# Patient Record
Sex: Female | Born: 1990 | Race: White | Hispanic: No | Marital: Single | Smoking: Current some day smoker
Health system: Southern US, Community
[De-identification: ages and names within clinical notes are randomized; demographics above are authoritative.]

## PROBLEM LIST (undated history)

## (undated) DIAGNOSIS — R569 Unspecified convulsions: Secondary | ICD-10-CM

## (undated) DIAGNOSIS — M25569 Pain in unspecified knee: Secondary | ICD-10-CM

## (undated) HISTORY — DX: Pain in unspecified knee: M25.569

## (undated) HISTORY — DX: Unspecified convulsions: R56.9

## (undated) HISTORY — PX: KNEE SURGERY: SHX244

---
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
## 2012-05-27 ENCOUNTER — Encounter (HOSPITAL_COMMUNITY): Payer: Self-pay | Admitting: *Deleted

## 2012-05-27 ENCOUNTER — Emergency Department (HOSPITAL_COMMUNITY): Payer: Medicaid Other

## 2012-05-27 ENCOUNTER — Emergency Department (HOSPITAL_COMMUNITY)
Admission: EM | Admit: 2012-05-27 | Discharge: 2012-05-28 | Disposition: A | Discharge status: Home / Self Care | Payer: Medicaid Other | Attending: Emergency Medicine | Admitting: Emergency Medicine

## 2012-05-27 DIAGNOSIS — Z3202 Encounter for pregnancy test, result negative: Secondary | ICD-10-CM | POA: Insufficient documentation

## 2012-05-27 DIAGNOSIS — M549 Dorsalgia, unspecified: Secondary | ICD-10-CM | POA: Insufficient documentation

## 2012-05-27 DIAGNOSIS — N390 Urinary tract infection, site not specified: Secondary | ICD-10-CM | POA: Insufficient documentation

## 2012-05-27 DIAGNOSIS — Z79899 Other long term (current) drug therapy: Secondary | ICD-10-CM | POA: Insufficient documentation

## 2012-05-27 DIAGNOSIS — R300 Dysuria: Secondary | ICD-10-CM | POA: Insufficient documentation

## 2012-05-27 LAB — URINE MICROSCOPIC-ADD ON

## 2012-05-27 LAB — URINALYSIS, ROUTINE W REFLEX MICROSCOPIC
Leukocytes, UA: NEGATIVE
Nitrite: NEGATIVE
Protein, ur: NEGATIVE mg/dL
Specific Gravity, Urine: 1.01 (ref 1.005–1.030)
Urobilinogen, UA: 0.2 mg/dL (ref 0.0–1.0)

## 2012-05-27 LAB — PREGNANCY, URINE: Preg Test, Ur: NEGATIVE

## 2012-05-27 MED ORDER — MORPHINE SULFATE 4 MG/ML IJ SOLN
4.0000 mg | Freq: Once | INTRAMUSCULAR | Status: AC
  Administered 2012-05-27: 4 mg via INTRAMUSCULAR
  Filled 2012-05-27: qty 1

## 2012-05-27 MED ORDER — ONDANSETRON 8 MG PO TBDP
8.0000 mg | ORAL_TABLET | Freq: Once | ORAL | Status: AC
  Administered 2012-05-27: 8 mg via ORAL
  Filled 2012-05-27: qty 1

## 2012-05-27 NOTE — ED Provider Notes (Signed)
History     CSN: [id number]  Arrival date & time 05/27/12  2156   First MD Initiated Contact with Patient 05/27/12 2205      Chief Complaint  Patient presents with  . Abdominal Pain    (Consider location/radiation/quality/duration/timing/severity/associated sxs/prior treatment) HPI Comments: Laurie Grant is a 22 y.o. Female presenting with back pain and painful urination.  She reports having right mid back pain which started yesterday which was sharp,  Constant and not worsened with movement.  This symptom was better today despite no treatment,  Then 4 hours ago developed severe burning pain at her urethra which is worsened with urination.  She denies fevers, chills, nausea and vomiting.  She has had no hematuria and denies vaginal discharge.  Her LMP is unknown as she is on depo. She does have a family history of kidney stones.     The history is provided by the patient.    History reviewed. No pertinent past medical history.  Past Surgical History  Procedure Laterality Date  . Knee surgery      History reviewed. No pertinent family history.  History  Substance Use Topics  . Smoking status: Never Smoker   . Smokeless tobacco: Not on file  . Alcohol Use: No    OB History   Grav Para Term Preterm Abortions TAB SAB Ect Mult Living                  Review of Systems  Constitutional: Negative for fever and chills.  HENT: Negative for congestion, sore throat and neck pain.   Eyes: Negative.   Respiratory: Negative for chest tightness and shortness of breath.   Cardiovascular: Negative for chest pain.  Gastrointestinal: Negative for nausea and abdominal pain.  Genitourinary: Positive for dysuria.  Musculoskeletal: Positive for back pain. Negative for joint swelling and arthralgias.  Skin: Negative.  Negative for rash and wound.  Neurological: Negative for dizziness, weakness, light-headedness, numbness and headaches.  Psychiatric/Behavioral: Negative.      Allergies  Amoxicillin; Penicillins; and Tramadol  Home Medications   Current Outpatient Rx  Name  Route  Sig  Dispense  Refill  . albuterol (PROVENTIL HFA;VENTOLIN HFA) 108 (90 BASE) MCG/ACT inhaler   Inhalation   Inhale 2 puffs into the lungs every 6 (six) hours as needed for wheezing or shortness of breath.         Marland Kitchen ibuprofen (ADVIL,MOTRIN) 200 MG tablet   Oral   Take 800 mg by mouth daily as needed for pain.         . medroxyPROGESTERone (DEPO-PROVERA) 150 MG/ML injection   Intramuscular   Inject 150 mg into the muscle every 3 (three) months. Due in August for next injection          . Naproxen Sodium (ALEVE) 220 MG CAPS   Oral   Take 220 mg by mouth once as needed (for pain).         Marland Kitchen HYDROcodone-acetaminophen (NORCO/VICODIN) 5-325 MG per tablet   Oral   Take 1 tablet by mouth every 4 (four) hours as needed for pain.   6 tablet   0   . nitrofurantoin, macrocrystal-monohydrate, (MACROBID) 100 MG capsule   Oral   Take 1 capsule (100 mg total) by mouth 2 (two) times daily.   10 capsule   0   . phenazopyridine (PYRIDIUM) 200 MG tablet   Oral   Take 1 tablet (200 mg total) by mouth 3 (three) times daily.   6 tablet  0     BP 126/81  Pulse 93  Temp(Src) 97.9 F (36.6 C) (Oral)  Resp 18  SpO2 95%  Physical Exam  Nursing note and vitals reviewed. Constitutional: She appears well-developed and well-nourished.  HENT:  Head: Normocephalic and atraumatic.  Eyes: Conjunctivae are normal.  Neck: Normal range of motion.  Cardiovascular: Normal rate, regular rhythm, normal heart sounds and intact distal pulses.   Pulmonary/Chest: Effort normal and breath sounds normal. She has no wheezes.  Abdominal: Soft. Bowel sounds are normal. She exhibits no distension. There is tenderness in the right lower quadrant and suprapubic area. There is CVA tenderness. There is no rebound, no guarding and no tenderness at McBurney's point.  Musculoskeletal: Normal range  of motion.  Neurological: She is alert.  Skin: Skin is warm and dry.  Psychiatric: She has a normal mood and affect.    ED Course  Procedures (including critical care time)  Labs Reviewed  URINALYSIS, ROUTINE W REFLEX MICROSCOPIC - Abnormal; Notable for the following:    Hgb urine dipstick MODERATE (*)    All other components within normal limits  URINE MICROSCOPIC-ADD ON - Abnormal; Notable for the following:    Squamous Epithelial / LPF FEW (*)    Bacteria, UA FEW (*)    All other components within normal limits  PREGNANCY, URINE   Ct Abdomen Pelvis Wo Contrast  05/28/2012  *RADIOLOGY REPORT*  Clinical Data: Flank pain  CT ABDOMEN AND PELVIS WITHOUT CONTRAST  Technique:  Multidetector CT imaging of the abdomen and pelvis was performed following the standard protocol without intravenous contrast.  Comparison: None.  Findings: Lung bases are clear.  Heart size within normal limits.  Organ abnormality/lesion detection is limited in the absence of intravenous contrast. Within this limitation, unremarkable liver, biliary system, spleen, pancreas, adrenal glands.  Symmetric renal size.  No hydronephrosis or hydroureter.  No urinary tract calculi.  No CT evidence for colitis.  Normal appendix.  Small bowel loops are normal course and caliber.  No free intraperitoneal air or fluid.  No lymphadenopathy.  Normal caliber aorta and branch vessels.  Small fat containing umbilical hernia.  Decompressed bladder limits evaluation.  Unremarkable CT appearance to the uterus and adnexa.  No acute osseous finding. Broad-based L5 S1 disc bulge results in mild central canal narrowing.  IMPRESSION: No hydronephrosis or urinary tract calculi identified.   Original Report Authenticated By: Jearld Lesch, M.D.      1. Dysuria   2. UTI (lower urinary tract infection)       MDM  Pt with symptoms suggesting possible kidney stone with family hx of same.  Ct scan negative.  Urine cx sent,  Will tx uti with  macrobid,  Pyridium for probable bladder spasm causing pain.  #6 hydrocodone for pain relief in the next 24 hours until abx and pyridium offer improvement.  Encouraged recheck by pcp if not improving this week.  Patients labs and/or radiological studies were reviewed during the medical decision making and disposition process.         Burgess Amor, PA-C 05/28/12 [phone]

## 2012-05-27 NOTE — ED Notes (Signed)
Low abd pain, dysuria,No d/c  .  NO nvd

## 2012-05-27 NOTE — ED Notes (Signed)
Pt c/o lower abdominal pain x 3hours. States there is burning with urination.

## 2012-05-28 MED ORDER — NITROFURANTOIN MONOHYD MACRO 100 MG PO CAPS: 100.0000 mg | ORAL_CAPSULE | Freq: Two times a day (BID) | ORAL | Status: DC

## 2012-05-28 MED ORDER — NITROFURANTOIN MONOHYD MACRO 100 MG PO CAPS
100.0000 mg | ORAL_CAPSULE | Freq: Once | ORAL | Status: AC
  Administered 2012-05-28: 100 mg via ORAL
  Filled 2012-05-28: qty 1

## 2012-05-28 MED ORDER — PHENAZOPYRIDINE HCL 200 MG PO TABS: 200.0000 mg | ORAL_TABLET | Freq: Three times a day (TID) | ORAL | Status: DC

## 2012-05-28 MED ORDER — HYDROCODONE-ACETAMINOPHEN 5-325 MG PO TABS: 1.0000 | ORAL_TABLET | ORAL | Status: DC | PRN

## 2012-05-28 MED ORDER — PHENAZOPYRIDINE HCL 100 MG PO TABS
200.0000 mg | ORAL_TABLET | Freq: Once | ORAL | Status: AC
  Administered 2012-05-28: 200 mg via ORAL
  Filled 2012-05-28: qty 2

## 2012-05-28 NOTE — ED Provider Notes (Signed)
Medical screening examination/treatment/procedure(s) were performed by non-physician practitioner and as supervising physician I was immediately available for consultation/collaboration.   September Mormile B. Bernette Mayers, MD 05/28/12 1207

## 2012-08-19 ENCOUNTER — Ambulatory Visit (INDEPENDENT_AMBULATORY_CARE_PROVIDER_SITE_OTHER): Payer: Medicaid Other

## 2012-08-19 ENCOUNTER — Encounter: Payer: Self-pay | Admitting: General Practice

## 2012-08-19 ENCOUNTER — Ambulatory Visit (INDEPENDENT_AMBULATORY_CARE_PROVIDER_SITE_OTHER): Payer: Medicaid Other | Admitting: General Practice

## 2012-08-19 VITALS — BP 138/86 | HR 103 | Temp 100.2°F | Ht 66.0 in | Wt 172.0 lb

## 2012-08-19 DIAGNOSIS — S99929A Unspecified injury of unspecified foot, initial encounter: Secondary | ICD-10-CM

## 2012-08-19 DIAGNOSIS — S8991XA Unspecified injury of right lower leg, initial encounter: Secondary | ICD-10-CM

## 2012-08-19 DIAGNOSIS — S8990XA Unspecified injury of unspecified lower leg, initial encounter: Secondary | ICD-10-CM

## 2012-08-19 DIAGNOSIS — S99919A Unspecified injury of unspecified ankle, initial encounter: Secondary | ICD-10-CM

## 2012-08-19 NOTE — Progress Notes (Signed)
  Subjective:    Patient ID: Laurie Grant, female    DOB: November 28, 1990, 22 y.o.   MRN: [id number]  HPI Presents today with right knee pain. Reports she fell over her bed 4 days ago. Reports having surgery 4-5 years ago (screw placed). Reports taking advil for pain and discomfort. Reports pain is worse at night. Rates pain at 8 and 5 during the day on 1-10 scales. Reports taking ibuprofen 800 mg at home.     Review of Systems  Constitutional: Positive for chills. Negative for fever.  HENT: Negative for neck pain.   Respiratory: Negative for chest tightness, shortness of breath and wheezing.   Cardiovascular: Negative for chest pain, palpitations and leg swelling.  Genitourinary: Negative for difficulty urinating.  Skin: Negative.   Neurological: Negative for dizziness, syncope, weakness and headaches.  Psychiatric/Behavioral: Negative.        Objective:   Physical Exam  Constitutional: She is oriented to person, place, and time. She appears well-developed and well-nourished.  Cardiovascular: Normal rate, regular rhythm and normal heart sounds.   Pulmonary/Chest: Effort normal and breath sounds normal. No respiratory distress. She exhibits no tenderness.  Musculoskeletal: She exhibits edema and tenderness.  Slight edema and edema noted to right patella and shin area. Negative for warmth to touch  Neurological: She is alert and oriented to person, place, and time.  Skin: Skin is warm and dry.  Psychiatric: She has a normal mood and affect.   [facility] reading (PRIMARY) by Philomena Doheny, FNP-C, no dislocation or fracture noted.                                   Assessment & Plan:  1. Knee injury, right, initial encounter - DG Knee Complete 4 Views Right; Future - Ambulatory referral to Orthopedic Surgery -refrain from strenuous activities -rest affected area -Continue current anti-inflammatory -may apply ice to affected area, 10 minutes on and 45 minutes off three times  daily Patient verbalized understanding Coralie Keens, FNP-C

## 2012-08-23 ENCOUNTER — Ambulatory Visit (INDEPENDENT_AMBULATORY_CARE_PROVIDER_SITE_OTHER): Payer: Medicaid Other | Admitting: *Deleted

## 2012-08-23 DIAGNOSIS — IMO0001 Reserved for inherently not codable concepts without codable children: Secondary | ICD-10-CM

## 2012-08-23 DIAGNOSIS — Z309 Encounter for contraceptive management, unspecified: Secondary | ICD-10-CM

## 2012-08-23 NOTE — Progress Notes (Signed)
Patient hasnt had depo provera since Nov 2013 so we went ahead and ordered a serum pregnancy

## 2012-08-29 ENCOUNTER — Other Ambulatory Visit (HOSPITAL_COMMUNITY): Payer: Self-pay | Admitting: Orthopaedic Surgery

## 2012-08-29 DIAGNOSIS — G8929 Other chronic pain: Secondary | ICD-10-CM

## 2012-09-04 ENCOUNTER — Ambulatory Visit (HOSPITAL_COMMUNITY): Payer: Medicaid Other

## 2012-09-06 ENCOUNTER — Ambulatory Visit (HOSPITAL_COMMUNITY)
Admission: RE | Admit: 2012-09-06 | Discharge: 2012-09-06 | Disposition: A | Discharge status: Home / Self Care | Payer: Medicaid Other | Source: Ambulatory Visit | Attending: Orthopaedic Surgery | Admitting: Orthopaedic Surgery

## 2012-09-06 DIAGNOSIS — M7989 Other specified soft tissue disorders: Secondary | ICD-10-CM | POA: Diagnosis not present

## 2012-09-06 DIAGNOSIS — M2240 Chondromalacia patellae, unspecified knee: Secondary | ICD-10-CM | POA: Diagnosis not present

## 2012-09-06 DIAGNOSIS — M25569 Pain in unspecified knee: Secondary | ICD-10-CM | POA: Insufficient documentation

## 2012-09-06 DIAGNOSIS — G8929 Other chronic pain: Secondary | ICD-10-CM

## 2012-09-09 ENCOUNTER — Telehealth: Payer: Self-pay | Admitting: General Practice

## 2012-09-11 NOTE — Telephone Encounter (Signed)
Message left with patient to have serum pregnancy drawn, once results are negative she will pick med up from pharmacy and bring her for injection.

## 2012-09-16 ENCOUNTER — Telehealth: Payer: Self-pay | Admitting: Nurse Practitioner

## 2012-09-16 ENCOUNTER — Ambulatory Visit: Payer: Medicaid Other | Admitting: General Practice

## 2012-09-16 NOTE — Telephone Encounter (Signed)
appt given with mae 

## 2012-09-17 ENCOUNTER — Ambulatory Visit: Payer: Self-pay | Admitting: Family Medicine

## 2012-11-05 ENCOUNTER — Other Ambulatory Visit: Payer: Self-pay

## 2012-11-05 MED ORDER — MEDROXYPROGESTERONE ACETATE 150 MG/ML IM SUSP: 150.0000 mg | INTRAMUSCULAR | Status: DC

## 2012-11-05 NOTE — Telephone Encounter (Signed)
Last seen 04/19/12  [facility]

## 2012-11-17 ENCOUNTER — Emergency Department (HOSPITAL_COMMUNITY)
Admission: EM | Admit: 2012-11-17 | Discharge: 2012-11-17 | Disposition: A | Discharge status: Home / Self Care | Payer: Medicaid Other

## 2012-11-17 NOTE — ED Notes (Signed)
Pt called a second time for Triage, no answer.

## 2012-11-17 NOTE — ED Notes (Signed)
Called for Triage, no answer.

## 2012-11-17 NOTE — ED Notes (Signed)
No answer in waiting room X3 

## 2012-11-26 ENCOUNTER — Ambulatory Visit (INDEPENDENT_AMBULATORY_CARE_PROVIDER_SITE_OTHER): Payer: Medicaid Other | Admitting: Family Medicine

## 2012-11-26 ENCOUNTER — Encounter: Payer: Self-pay | Admitting: Family Medicine

## 2012-11-26 VITALS — BP 119/79 | HR 119 | Temp 98.3°F | Ht 66.0 in | Wt 159.0 lb

## 2012-11-26 DIAGNOSIS — R569 Unspecified convulsions: Secondary | ICD-10-CM

## 2012-11-26 NOTE — Progress Notes (Signed)
  Subjective:    Patient ID: Laurie Grant, female    DOB: 07-08-1990, 22 y.o.   MRN: [id number]  HPI  Patient resents today for hospital followup. Patient is noted have been seen in the ER on Friday night for a witnessed seizure while sleeping. Patient and her boyfriend were in the bed.Per report, patient fell asleep and had a witnessed seizure with generalized shaking a nodule in the back of her head. Patient presented to the ER after event. Per the patient and mom she had an EEG that was normal. Patient reports that she does smoke marijuana on the event as well as took tramadol for pain. Patient also notes that she was PCP positive on drug screen in the ER although patient denies taking PCP or other drug use.  This is patient's first seizure event. Patient has not had a history of this in the past. Patient is been asymptomatic since this point.    Review of Systems  All other systems reviewed and are negative.       Objective:   Physical Exam  Constitutional: She is oriented to person, place, and time. She appears well-developed and well-nourished.  HENT:  Head: Normocephalic and atraumatic.  Right Ear: External ear normal.  Left Ear: External ear normal.  Eyes: Conjunctivae are normal. Pupils are equal, round, and reactive to light.  Neck: Normal range of motion. Neck supple.  Cardiovascular: Normal rate, regular rhythm and normal heart sounds.   Pulmonary/Chest: Effort normal and breath sounds normal.  Neurological: She is alert and oriented to person, place, and time. Coordination normal.  Skin: Skin is warm.          Assessment & Plan:  Seizure - Plan: Comprehensive metabolic panel, EEG  In review of patient's recent visit to the ER, seizure activity seems to be secondary to a combination of medication and drug abuse. Combination of tramadol, Vicodin, marijuana, PCP likely lower patient's seizure threshold and cause secondary seizure event. Patient did have some  alleged light abnormalities on blood work as well. We'll recheck these today. Discussed with patient importance of illicit drug avoidance. Will also set up patient for outpatient EEG. Discuss neuro red flags at length. Avoid tramadol and Vicodin. Avoid marijuana and PCP. Discussed seizure precautions. Moderate level of medical decision making a fall this patient's care today.

## 2012-11-27 LAB — COMPREHENSIVE METABOLIC PANEL
Albumin/Globulin Ratio: 1.9 (ref 1.1–2.5)
Albumin: 4.8 g/dL (ref 3.5–5.5)
Alkaline Phosphatase: 83 IU/L (ref 39–117)
BUN/Creatinine Ratio: 19 (ref 8–20)
BUN: 17 mg/dL (ref 6–20)
CO2: 24 mmol/L (ref 18–29)
Creatinine, Ser: 0.91 mg/dL (ref 0.57–1.00)
GFR calc Af Amer: 105 mL/min/{1.73_m2} (ref >59)
GFR calc non Af Amer: 91 mL/min/{1.73_m2} (ref >59)
Globulin, Total: 2.5 g/dL (ref 1.5–4.5)
Total Bilirubin: 0.3 mg/dL (ref 0.0–1.2)

## 2012-12-11 ENCOUNTER — Telehealth: Payer: Self-pay | Admitting: Family Medicine

## 2012-12-12 ENCOUNTER — Ambulatory Visit: Payer: Medicaid Other | Admitting: Family Medicine

## 2012-12-16 NOTE — Progress Notes (Signed)
PT NOTIFIED NORMAL LABS.

## 2012-12-17 ENCOUNTER — Telehealth: Payer: Self-pay | Admitting: *Deleted

## 2012-12-19 NOTE — Telephone Encounter (Signed)
lmom 

## 2012-12-20 ENCOUNTER — Encounter: Payer: Self-pay | Admitting: Family Medicine

## 2012-12-20 ENCOUNTER — Ambulatory Visit (INDEPENDENT_AMBULATORY_CARE_PROVIDER_SITE_OTHER): Payer: Medicaid Other | Admitting: Family Medicine

## 2012-12-20 VITALS — BP 124/86 | HR 105 | Temp 98.6°F | Ht 66.0 in | Wt 166.8 lb

## 2012-12-20 DIAGNOSIS — R569 Unspecified convulsions: Secondary | ICD-10-CM

## 2012-12-20 DIAGNOSIS — J069 Acute upper respiratory infection, unspecified: Secondary | ICD-10-CM

## 2012-12-20 MED ORDER — AZITHROMYCIN 250 MG PO TABS: ORAL_TABLET | ORAL | Status: DC

## 2012-12-20 MED ORDER — ALBUTEROL SULFATE HFA 108 (90 BASE) MCG/ACT IN AERS: 2.0000 | INHALATION_SPRAY | Freq: Four times a day (QID) | RESPIRATORY_TRACT | Status: AC | PRN

## 2012-12-20 NOTE — Progress Notes (Signed)
  Subjective:    Patient ID: Laurie Grant, female    DOB: [birth year], 22 y.o.   MRN: [id number]  HPI This 22 y.o. female presents for evaluation of seizures.  She was seen at the  [facility] for a sz a month ago and she was advised to f/u with her PCP.  She states she was told that the sz's were due to the tramadol she was Taking for her knee.  She stopped the tramadol and she is not having sz since. She is having a sore throat.   Review of Systems C/o seizures and URI No chest pain, SOB, HA, dizziness, vision change, N/V, diarrhea, constipation, dysuria, urinary urgency or frequency, myalgias, arthralgias or rash.     Objective:   Physical Exam  Vital signs noted  Well developed well nourished female.  HEENT - Head atraumatic Normocephalic                Eyes - PERRLA, Conjuctiva - clear Sclera- Clear EOMI                Ears - EAC's Wnl TM's Wnl Gross Hearing WNL                Nose - Nares patent                 Throat - oropharanx wnl Respiratory - Lungs CTA bilateral Cardiac - RRR S1 and S2 without murmur GI - Abdomen soft Nontender and bowel sounds active x 4 Extremities - No edema. Neuro - Grossly intact.      Assessment & Plan:  Seizures - Plan: Ambulatory referral to Neurology  URI (upper respiratory infection) - Plan: azithromycin (ZITHROMAX) 250 MG tablet, albuterol (PROVENTIL HFA;VENTOLIN HFA) 108 (90 BASE) MCG/ACT inhaler   Deatra Canter FNP

## 2012-12-20 NOTE — Patient Instructions (Addendum)
Seizures You had a seizure. About 2% of the population will have a seizure problem during their lifetime. Sometimes the cause for the seizure is not known. Seizures are usually associated with one of these problems:  Epilepsy.   Not taking your seizure medicine.   Alcohol and drug abuse.   Head injury, strokes, tumors, and brain surgery.   High fever and infections.   Low blood sugar.  Evaluating a new seizure disorder may require having a brain scan or a brain wave test called an EEG. If you have been given a seizure medicine, it is very important that you take it as prescribed. Not taking these medicines as directed is the most common cause of seizures. Blood tests are often used to be sure you are taking the proper dose.  Seizures cause many different symptoms, from convulsions to brief blackouts. Do not ride a bike, drive a car, go swimming, climb in high or dangerous places such as ladders or roofs, or operate any dangerous equipment until you have your doctor's permission. If you hold a driver's license, state law may require that a report be made to the motor vehicles department. You should wear an emergency medical identification bracelet with information about your seizures. If you have any warning that a seizure may occur, lie down in a safe place to protect yourself. Teach your family and friends what to do if you have any further seizures. They should stay calm and try to keep you from falling on hard or sharp objects. It is best not to try to restrain a seizing person or to force anything into his or her mouth. Do not try to open clenched jaws. When the seizure is over, the person should be rolled on their side to help drain any vomit or secretions from the mouth. After a seizure, a person may be confused or drowsy for several minutes. An ambulance should be called if the seizure lasted more than 5 minutes or if confusion remains for more than 30 minutes. Call your caregiver or the  emergency department for further instructions. Do not drive until cleared by your caregiver or neurologist! Document Released: 04/13/2004 Document Revised: 11/16/2010 Document Reviewed: 03/06/2005 [facility] Patient Information 2012 [place], [place].

## 2012-12-27 ENCOUNTER — Telehealth: Payer: Self-pay | Admitting: Family Medicine

## 2013-04-24 ENCOUNTER — Ambulatory Visit: Payer: Medicaid Other | Admitting: Family Medicine

## 2013-08-29 ENCOUNTER — Telehealth: Payer: Self-pay | Admitting: Family Medicine

## 2013-08-29 DIAGNOSIS — R569 Unspecified convulsions: Secondary | ICD-10-CM

## 2013-09-02 NOTE — Telephone Encounter (Signed)
Notified pt's mother that referral was in process Verbalizes understanding

## 2013-09-08 ENCOUNTER — Encounter: Payer: Self-pay | Admitting: Neurology

## 2013-09-08 ENCOUNTER — Ambulatory Visit (INDEPENDENT_AMBULATORY_CARE_PROVIDER_SITE_OTHER): Payer: Medicaid Other | Admitting: Neurology

## 2013-09-08 VITALS — BP 120/78 | HR 95 | Ht 66.0 in | Wt 163.0 lb

## 2013-09-08 DIAGNOSIS — M25561 Pain in right knee: Secondary | ICD-10-CM

## 2013-09-08 DIAGNOSIS — R569 Unspecified convulsions: Secondary | ICD-10-CM

## 2013-09-08 DIAGNOSIS — M25562 Pain in left knee: Secondary | ICD-10-CM

## 2013-09-08 DIAGNOSIS — M25569 Pain in unspecified knee: Secondary | ICD-10-CM | POA: Insufficient documentation

## 2013-09-08 DIAGNOSIS — G4700 Insomnia, unspecified: Secondary | ICD-10-CM

## 2013-09-08 MED ORDER — LEVETIRACETAM ER 500 MG PO TB24: ORAL_TABLET | ORAL | Status: DC

## 2013-09-08 NOTE — Patient Instructions (Addendum)
1. Routine EEG 2. [facility] from [facility] will be requested 3. Start Levetiracetam ER 500mg : Take 1 tab at bedtime for 1 week, then increase to 2 tabs at bedtime 4. Start melatonin 3mg  at bedtime for sleep  Seizure Precautions: 1. If medication has been prescribed for you to prevent seizures, take it exactly as directed.  Do not stop taking the medicine without talking to your doctor first, even if you have not had a seizure in a long time.   2. Avoid activities in which a seizure would cause danger to yourself or to others.  Don't operate dangerous machinery, swim alone, or climb in high or dangerous places, such as on ladders, roofs, or girders.  Do not drive unless your doctor says you may.  3. If you have any warning that you may have a seizure, lay down in a safe place where you can't hurt yourself.    4.  No driving for 6 months from last seizure, as per [facility].   Please refer to the following link on the Epile[employer] of America's website for more information: http://www.epilepsyfoundation.org/answerplace/Social/driving/drivingu.cfm   5.  Maintain good sleep hygiene.  6.  Notify your neurology if you are planning pregnancy or if you become pregnant.  7.  Contact your doctor if you have any problems that may be related to the medicine you are taking.  8.  Call 911 and bring the patient back to the ED if:        A.  The seizure lasts longer than 5 minutes.       B.  The patient doesn't awaken shortly after the seizure  C.  The patient has new problems such as difficulty seeing, speaking or moving  D.  The patient was injured during the seizure  E.  The patient has a temperature over 102 F (39C)  F.  The patient vomited and now is having trouble breathing
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
## 2013-09-10 NOTE — Procedures (Signed)
ELECTROENCEPHALOGRAM REPORT  Date of Study: 09/08/2013  Patient's Name: Laurie Grant MRN: [id number] Date of Birth: 06/23/1940  Referring Gizelle Whetsel: Dr. Patrcia DollyKaren Grant  Clinical History: This is a 23 year old woman with 3 generalized convulsions.  Family reported staring and unresponsiveness prior to the most recent seizure, as well as right-sided mouth twitching  Medications: No antiepileptic medication  Technical Summary: A multichannel digital EEG recording measured by the international 10-20 system with electrodes applied with paste and impedances below 5000 ohms performed in our laboratory with EKG monitoring in an awake and asleep patient.  Hyperventilation and photic stimulation were performed.  The digital EEG was referentially recorded, reformatted, and digitally filtered in a variety of bipolar and referential montages for optimal display.  Spike detection software was employed.  Description: The patient is awake and asleep during the recording.  During maximal wakefulness, there is a symmetric, medium voltage 8 Hz posterior dominant rhythm that attenuates with eye opening.  The record is symmetric.  During drowsiness and sleep, there is an increase in theta slowing of the background with high voltage vertex waves noted. Hyperventilation and photic stimulation did not elicit any abnormalities.  There were no epileptiform discharges or electrographic seizures seen.    EKG lead was unremarkable.  Impression: This awake and asleep EEG is normal.    Clinical Correlation: A normal EEG does not exclude a clinical diagnosis of epilepsy.  If further clinical questions remain, prolonged EEG may be helpful.  Clinical correlation is advised.   Laurie Grant, M.D.

## 2013-10-08 ENCOUNTER — Other Ambulatory Visit: Payer: Self-pay | Admitting: Nurse Practitioner

## 2013-10-10 ENCOUNTER — Telehealth: Payer: Self-pay | Admitting: General Practice

## 2013-10-10 NOTE — Telephone Encounter (Signed)
Patient aware rx was sent in to pharmacy on the 22nd

## 2013-11-10 ENCOUNTER — Ambulatory Visit: Payer: Medicaid Other | Admitting: Neurology

## 2013-11-13 ENCOUNTER — Telehealth: Payer: Self-pay | Admitting: Neurology

## 2013-11-13 ENCOUNTER — Encounter: Payer: Self-pay | Admitting: *Deleted

## 2013-11-13 NOTE — Progress Notes (Signed)
No show letter sent for 11-10-2013 

## 2013-11-13 NOTE — Telephone Encounter (Signed)
Pt no showed 11/10/13 appt with Dr. Karel Jarvis. No show letter mailed to pt / Sherri S.

## 2013-11-23 ENCOUNTER — Encounter (HOSPITAL_COMMUNITY): Payer: Self-pay | Admitting: Emergency Medicine

## 2013-11-23 ENCOUNTER — Emergency Department (HOSPITAL_COMMUNITY)
Admission: EM | Admit: 2013-11-23 | Discharge: 2013-11-23 | Disposition: A | Discharge status: Home / Self Care | Payer: Medicaid Other | Attending: Emergency Medicine | Admitting: Emergency Medicine

## 2013-11-23 DIAGNOSIS — G40909 Epilepsy, unspecified, not intractable, without status epilepticus: Secondary | ICD-10-CM | POA: Insufficient documentation

## 2013-11-23 DIAGNOSIS — Z79899 Other long term (current) drug therapy: Secondary | ICD-10-CM | POA: Diagnosis not present

## 2013-11-23 DIAGNOSIS — Z880 Allergy status to penicillin: Secondary | ICD-10-CM | POA: Diagnosis not present

## 2013-11-23 DIAGNOSIS — K0889 Other specified disorders of teeth and supporting structures: Secondary | ICD-10-CM

## 2013-11-23 DIAGNOSIS — K089 Disorder of teeth and supporting structures, unspecified: Secondary | ICD-10-CM | POA: Diagnosis present

## 2013-11-23 DIAGNOSIS — Z8739 Personal history of other diseases of the musculoskeletal system and connective tissue: Secondary | ICD-10-CM | POA: Insufficient documentation

## 2013-11-23 MED ORDER — CLINDAMYCIN HCL 150 MG PO CAPS
300.0000 mg | ORAL_CAPSULE | Freq: Once | ORAL | Status: AC
  Administered 2013-11-23: 300 mg via ORAL
  Filled 2013-11-23: qty 2

## 2013-11-23 MED ORDER — IBUPROFEN 800 MG PO TABS
800.0000 mg | ORAL_TABLET | Freq: Once | ORAL | Status: AC
  Administered 2013-11-23: 800 mg via ORAL
  Filled 2013-11-23: qty 1

## 2013-11-23 MED ORDER — CLINDAMYCIN HCL 150 MG PO CAPS: ORAL_CAPSULE | ORAL | Status: DC

## 2013-11-23 MED ORDER — IBUPROFEN 800 MG PO TABS: 800.0000 mg | ORAL_TABLET | Freq: Three times a day (TID) | ORAL | Status: DC

## 2013-11-23 NOTE — ED Notes (Signed)
Pt c/o L. L. Dental pain. Pt states part of tooth broke off.

## 2013-11-23 NOTE — Discharge Instructions (Signed)
Please use clindamycin with breakfast lunch dinner and bedtime until all taken. Please use ibuprofen 3 times daily with a meal. It is important that you see a dentist as soon as possible. Dental Pain A tooth ache may be caused by cavities (tooth decay). Cavities expose the nerve of the tooth to air and hot or cold temperatures. It may come from an infection or abscess (also called a boil or furuncle) around your tooth. It is also often caused by dental caries (tooth decay). This causes the pain you are having. DIAGNOSIS  Your caregiver can diagnose this problem by exam. TREATMENT   If caused by an infection, it may be treated with medications which kill germs (antibiotics) and pain medications as prescribed by your caregiver. Take medications as directed.  Only take over-the-counter or prescription medicines for pain, discomfort, or fever as directed by your caregiver.  Whether the tooth ache today is caused by infection or dental disease, you should see your dentist as soon as possible for further care. SEEK MEDICAL CARE IF: The exam and treatment you received today has been provided on an emergency basis only. This is not a substitute for complete medical or dental care. If your problem worsens or new problems (symptoms) appear, and you are unable to meet with your dentist, call or return to this location. SEEK IMMEDIATE MEDICAL CARE IF:   You have a fever.  You develop redness and swelling of your face, jaw, or neck.  You are unable to open your mouth.  You have severe pain uncontrolled by pain medicine. MAKE SURE YOU:   Understand these instructions.  Will watch your condition.  Will get help right away if you are not doing well or get worse. Document Released: 03/06/2005 Document Revised: 05/29/2011 Document Reviewed: 10/23/2007 [facility] Patient Information 2015 [place], [place]. This information is not intended to replace advice given to you by your health care provider. Make sure  you discuss any questions you have with your health care provider.

## 2013-11-23 NOTE — ED Provider Notes (Signed)
CSN: [id number]     Arrival date & time 11/23/13  1757 History  This chart was scribed for non-physician practitioner, Ivery Quale, PA-C,working with Hurman Horn, MD, by Karle Plumber, ED Scribe. This patient was seen in room APFT22/APFT22 and the patient's care was started at 7:06 PM.  Chief Complaint  Patient presents with  . Dental Pain   Patient is a 23 y.o. female presenting with tooth pain. The history is provided by the patient. No language interpreter was used.  Dental Pain Associated symptoms: no fever    HPI Comments:  Laurie Grant is a 23 y.o. female with PMH of seizures who presents to the Emergency Department complaining of severe throbbing left lower dental pain secondary to breaking a tooth approximately one week ago. Pt states she was eating something when the tooth broke and she has been experiencing increased pain. She reports something black being stuck down in the remainder of the tooth. The tooth bled last night but has since stopped. She denies fever, chills or drainage from the area. She states she has not seen a dentist since the incident. She reports an allergy to PCN with a reaction of throat swelling and hives.  Past Medical History  Diagnosis Date  . Knee pain   . Seizures    Past Surgical History  Procedure Laterality Date  . Knee surgery    . Knee surgery Bilateral     knees   Family History  Problem Relation Age of Onset  . Arthritis Mother   . Asthma Mother   . Depression Mother   . Diabetes Mother   . Hyperlipidemia Mother   . Hypertension Mother   . Hypertension Father   . Healthy Brother   . Healthy Brother    History  Substance Use Topics  . Smoking status: Never Smoker   . Smokeless tobacco: Never Used  . Alcohol Use: No   OB History   Grav Para Term Preterm Abortions TAB SAB Ect Mult Living                 Review of Systems  Constitutional: Negative for fever and chills.  HENT: Positive for dental problem.   All other  systems reviewed and are negative.   Allergies  Amoxicillin and Penicillins  Home Medications   Prior to Admission medications   Medication Sig Start Date End Date Taking? Authorizing Provider  levETIRAcetam (KEPPRA) 500 MG tablet Take 1,000 mg by mouth at bedtime.   Yes Historical Provider, MD  medroxyPROGESTERone (DEPO-PROVERA) 150 MG/ML injection INJECT 1 ML (150 MG TOTAL) INTO THE MUSCLE EVERY 3 (THREE) MONTHS. DUE IN [place] FOR NEXT INJECTION   Yes Deatra Canter, FNP  albuterol (PROVENTIL HFA;VENTOLIN HFA) 108 (90 BASE) MCG/ACT inhaler Inhale 2 puffs into the lungs every 6 (six) hours as needed for wheezing or shortness of breath. 12/20/12   Deatra Canter, FNP   Triage Vitals: BP 126/85  Pulse 115  Temp(Src) 99 F (37.2 C) (Oral)  Resp 20  Ht  (1.676 m)  Wt 150 lb (68.04 kg)  BMI 24.22 kg/m2  SpO2 100% Physical Exam  Nursing note and vitals reviewed. Constitutional: She is oriented to person, place, and time. She appears well-developed and well-nourished.  HENT:  Head: Normocephalic and atraumatic.  No facial asymmetry. Cavity involving left lower premolar. Mild swelling of the gum but no visible abscess or foreign body present. Airway patent.  Eyes: EOM are normal.  Neck: Normal range of  motion.  Cardiovascular: Normal rate.   Pulmonary/Chest: Effort normal.  Musculoskeletal: Normal range of motion.  Lymphadenopathy:       Head (right side): Submental adenopathy present.  Mild right-sided submental lymphadenopathy.  Neurological: She is alert and oriented to person, place, and time.  Skin: Skin is warm and dry.  Psychiatric: She has a normal mood and affect. Her behavior is normal.    ED Course  Procedures (including critical care time) DIAGNOSTIC STUDIES: Oxygen Saturation is 100% on RA, normal by my interpretation.   COORDINATION OF CARE: 7:12 PM- Advised pt to follow up with a dentist. Will prescribe antibiotics. Pt verbalizes understanding and  agrees to plan.  Medications - No data to display  Labs Review Labs Reviewed - No data to display  Imaging Review No results found.   EKG Interpretation None      MDM No dental abscess. No evidence for Ludwig's angina.  Pt advised to see a dentist as soon as possible. Rx for clindamycin and ibuprofen  given.    Final diagnoses:  None    **I have reviewed nursing notes, vital signs, and all appropriate lab and imaging results for this patient.*  I personally performed the services described in this documentation, which was scribed in my presence. The recorded information has been reviewed and is accurate.    Kathie Dike, PA-C 11/23/13 [name]

## 2013-11-24 NOTE — ED Provider Notes (Signed)
Medical screening examination/treatment/procedure(s) were performed by non-physician practitioner and as supervising physician I was immediately available for consultation/collaboration.   EKG Interpretation None       Lendora Keys M Uzziel Russey, MD 11/24/13 2159 

## 2014-03-30 ENCOUNTER — Ambulatory Visit: Payer: Medicaid Other | Admitting: Neurology

## 2014-03-31 ENCOUNTER — Ambulatory Visit (INDEPENDENT_AMBULATORY_CARE_PROVIDER_SITE_OTHER): Payer: Medicaid Other | Admitting: Neurology

## 2014-03-31 ENCOUNTER — Telehealth: Payer: Self-pay | Admitting: Neurology

## 2014-03-31 ENCOUNTER — Encounter: Payer: Self-pay | Admitting: Neurology

## 2014-03-31 VITALS — BP 122/82 | HR 96 | Resp 16 | Ht 66.0 in | Wt 167.0 lb

## 2014-03-31 DIAGNOSIS — M25562 Pain in left knee: Secondary | ICD-10-CM

## 2014-03-31 DIAGNOSIS — M25561 Pain in right knee: Secondary | ICD-10-CM

## 2014-03-31 DIAGNOSIS — G40009 Localization-related (focal) (partial) idiopathic epilepsy and epileptic syndromes with seizures of localized onset, not intractable, without status epilepticus: Secondary | ICD-10-CM

## 2014-03-31 MED ORDER — LEVETIRACETAM ER 500 MG PO TB24: ORAL_TABLET | ORAL | Status: DC

## 2014-03-31 NOTE — Telephone Encounter (Signed)
Pt had appt 03/30/14 but did not come, it looks like she has since scheduled and came in for an appt today. No need for a no show letter but 03/30/14 appt will be considered a no show in EPIC / Sherri S.
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
## 2014-04-07 ENCOUNTER — Encounter: Payer: Self-pay | Admitting: Neurology

## 2014-05-08 ENCOUNTER — Other Ambulatory Visit: Payer: Self-pay | Admitting: Family Medicine

## 2014-10-08 ENCOUNTER — Ambulatory Visit: Payer: Medicaid Other | Admitting: Neurology

## 2014-11-04 ENCOUNTER — Ambulatory Visit (INDEPENDENT_AMBULATORY_CARE_PROVIDER_SITE_OTHER): Payer: Medicaid Other | Admitting: Family Medicine

## 2014-11-04 ENCOUNTER — Encounter: Payer: Self-pay | Admitting: Family Medicine

## 2014-11-04 VITALS — BP 126/79 | HR 113 | Temp 98.7°F | Ht 66.0 in | Wt 169.2 lb

## 2014-11-04 DIAGNOSIS — M25561 Pain in right knee: Secondary | ICD-10-CM

## 2014-11-04 DIAGNOSIS — M25562 Pain in left knee: Secondary | ICD-10-CM | POA: Diagnosis not present

## 2014-11-04 DIAGNOSIS — M94269 Chondromalacia, unspecified knee: Secondary | ICD-10-CM | POA: Diagnosis not present

## 2014-11-04 MED ORDER — MEDROXYPROGESTERONE ACETATE 150 MG/ML IM SUSP: INTRAMUSCULAR | Status: AC

## 2014-11-04 MED ORDER — PREDNISONE 20 MG PO TABS: ORAL_TABLET | ORAL | Status: DC

## 2014-11-04 MED ORDER — TRAZODONE HCL 150 MG PO TABS: ORAL_TABLET | ORAL | Status: DC

## 2014-11-04 NOTE — Progress Notes (Signed)
Subjective:  Patient ID: Laurie Grant, female    DOB: 1990/12/19  Age: 24 y.o. MRN: [id number]  CC: Knee Pain   HPI Laurie Grant presents for pain bilat knee, chronic recurring. Now 4/10. 10/10 after exertion such as walking around [place]. Using ibuprofen, tylenol, BCs.  History Laurie Grant has a past medical history of Knee pain and Seizures.   Laurie Grant has past surgical history that includes Knee surgery and Knee surgery (Bilateral).   Laurie Grant family history includes Arthritis in Laurie Grant mother; Asthma in Laurie Grant mother; Depression in Laurie Grant mother; Diabetes in Laurie Grant mother; Healthy in Laurie Grant brother and brother; Hyperlipidemia in Laurie Grant mother; Hypertension in Laurie Grant father and mother.Laurie Grant reports that Laurie Grant has been smoking.  Laurie Grant has never used smokeless tobacco. Laurie Grant reports that Laurie Grant drinks alcohol. Laurie Grant reports that Laurie Grant does not use illicit drugs.  Outpatient Prescriptions Prior to Visit  Medication Sig Dispense Refill  . levETIRAcetam (KEPPRA XR) 500 MG 24 hr tablet Take 2 tablets at bedtime 60 tablet 11  . albuterol (PROVENTIL HFA;VENTOLIN HFA) 108 (90 BASE) MCG/ACT inhaler Inhale 2 puffs into the lungs every 6 (six) hours as needed for wheezing or shortness of breath. 1 Inhaler 5  . ibuprofen (ADVIL,MOTRIN) 800 MG tablet Take 1 tablet (800 mg total) by mouth 3 (three) times daily. (Patient taking differently: Take 800 mg by mouth every 6 (six) hours as needed. ) 21 tablet 0  . medroxyPROGESTERone (DEPO-PROVERA) 150 MG/ML injection INJECT 1 ML (150 MG TOTAL) INTO THE MUSCLE EVERY 3 (THREE) MONTHS. DUE IN AUGUST FOR NEXT INJECTION 1 mL 2   No facility-administered medications prior to visit.    ROS Review of Systems  Constitutional: Negative for fever, chills, diaphoresis, appetite change and fatigue.  HENT: Negative for congestion, ear pain, hearing loss, postnasal drip, rhinorrhea, sore throat and trouble swallowing.   Respiratory: Negative for cough, chest tightness and shortness of breath.   Cardiovascular:  Negative for chest pain and palpitations.  Gastrointestinal: Negative for abdominal pain.  Musculoskeletal: Positive for arthralgias.  Skin: Negative for rash.    Objective:  BP 126/79 mmHg  Pulse 113  Temp(Src) 98.7 F (37.1 C) (Oral)  Ht 5\' 6"  (1.676 m)  Wt 169 lb 3.2 oz (76.749 kg)  BMI 27.32 kg/m2  BP Readings from Last 3 Encounters:  11/04/14 126/79  03/31/14 122/82  11/23/13 126/85    Wt Readings from Last 3 Encounters:  11/04/14 169 lb 3.2 oz (76.749 kg)  03/31/14 167 lb (75.751 kg)  11/23/13 150 lb (68.04 kg)     Physical Exam  Constitutional: Laurie Grant is oriented to person, place, and time. Laurie Grant appears well-developed and well-nourished. No distress.  HENT:  Head: Normocephalic and atraumatic.  Right Ear: External ear normal.  Left Ear: External ear normal.  Nose: Nose normal.  Mouth/Throat: Oropharynx is clear and moist.  Eyes: Conjunctivae and EOM are normal. Pupils are equal, round, and reactive to light.  Neck: Normal range of motion. Neck supple. No thyromegaly present.  Cardiovascular: Normal rate, regular rhythm and normal heart sounds.   No murmur heard. Pulmonary/Chest: Effort normal and breath sounds normal. No respiratory distress. Laurie Grant has no wheezes. Laurie Grant has no rales.  Musculoskeletal: Normal range of motion. Laurie Grant exhibits tenderness (; At both knees).  Lymphadenopathy:    Laurie Grant has no cervical adenopathy.  Neurological: Laurie Grant is alert and oriented to person, place, and time. Laurie Grant has normal reflexes.  Skin: Skin is warm and dry.  Psychiatric: Laurie Grant has a normal mood and  affect. Laurie Grant behavior is normal. Judgment and thought content normal.    No results found for: HGBA1C  Lab Results  Component Value Date   GLUCOSE 70 11/26/2012   ALT 18 11/26/2012   AST 17 11/26/2012   NA 140 11/26/2012   K 4.4 11/26/2012   CL 100 11/26/2012   CREATININE 0.91 11/26/2012   BUN 17 11/26/2012   CO2 24 11/26/2012    No results found.  Assessment & Plan:   Laurie Grant  was seen today for knee pain.  Diagnoses and all orders for this visit:  Bilateral knee pain -     Ambulatory referral to Orthopedic Surgery  Chondromalacia of knee, unspecified laterality  Other orders -     predniSONE (DELTASONE) 20 MG tablet; 1 twice daily for 2 weeks then 1 daily for 2 weeks -     medroxyPROGESTERone (DEPO-PROVERA) 150 MG/ML injection; INJECT 1 ML (150 MG TOTAL) INTO THE MUSCLE EVERY 3 (THREE) MONTHS. DUE IN AUGUST FOR NEXT INJECTION -     traZODone (DESYREL) 150 MG tablet; 1 or 2 at bedtime for sleep   I have discontinued Laurie Grant's ibuprofen. I am also having Laurie Grant start on predniSONE and traZODone. Additionally, I am having Laurie Grant maintain Laurie Grant albuterol, levETIRAcetam, and medroxyPROGESTERone.  Meds ordered this encounter  Medications  . predniSONE (DELTASONE) 20 MG tablet    Sig: 1 twice daily for 2 weeks then 1 daily for 2 weeks    Dispense:  45 tablet    Refill:  0  . medroxyPROGESTERone (DEPO-PROVERA) 150 MG/ML injection    Sig: INJECT 1 ML (150 MG TOTAL) INTO THE MUSCLE EVERY 3 (THREE) MONTHS. DUE IN AUGUST FOR NEXT INJECTION    Dispense:  1 mL    Refill:  2  . traZODone (DESYREL) 150 MG tablet    Sig: 1 or 2 at bedtime for sleep    Dispense:  60 tablet    Refill:  2     Follow-up: No Follow-up on file.  Mechele Claude, M.D.

## 2015-04-06 ENCOUNTER — Other Ambulatory Visit: Payer: Self-pay | Admitting: Neurology

## 2015-04-07 ENCOUNTER — Telehealth: Payer: Self-pay | Admitting: Neurology

## 2015-04-07 DIAGNOSIS — G40009 Localization-related (focal) (partial) idiopathic epilepsy and epileptic syndromes with seizures of localized onset, not intractable, without status epilepticus: Secondary | ICD-10-CM

## 2015-04-07 MED ORDER — LEVETIRACETAM ER 500 MG PO TB24: ORAL_TABLET | ORAL | Status: DC

## 2015-04-07 NOTE — Telephone Encounter (Signed)
PT called and needs a prescription called in for Keppra/[name] CB# [phone]

## 2015-04-07 NOTE — Telephone Encounter (Signed)
Rx refill sent to pharmacy. 

## 2015-06-23 ENCOUNTER — Telehealth: Payer: Self-pay | Admitting: Neurology

## 2015-06-23 ENCOUNTER — Ambulatory Visit: Payer: Medicaid Other | Admitting: Neurology

## 2015-06-23 DIAGNOSIS — G40009 Localization-related (focal) (partial) idiopathic epilepsy and epileptic syndromes with seizures of localized onset, not intractable, without status epilepticus: Secondary | ICD-10-CM

## 2015-06-23 MED ORDER — LEVETIRACETAM ER 500 MG PO TB24: ORAL_TABLET | ORAL | Status: DC

## 2015-06-23 NOTE — Telephone Encounter (Signed)
Laurie Grant 03/13/1991 needs a refill on her medication Levetiracetamer? 500 mg. She did reschedule her appointment from today to 4/24 at 10:00. Her contact number is [phone]. Thank you

## 2015-07-12 ENCOUNTER — Telehealth: Payer: Self-pay | Admitting: Neurology

## 2015-07-12 ENCOUNTER — Ambulatory Visit: Payer: Medicaid Other | Admitting: Neurology

## 2015-07-12 DIAGNOSIS — G40009 Localization-related (focal) (partial) idiopathic epilepsy and epileptic syndromes with seizures of localized onset, not intractable, without status epilepticus: Secondary | ICD-10-CM

## 2015-07-12 NOTE — Telephone Encounter (Signed)
VM-PT left a message saying she is going to need a refill on her seizure medication, she said she has 20 or 30 left/[name] CB# [phone]

## 2015-07-13 MED ORDER — LEVETIRACETAM ER 500 MG PO TB24: ORAL_TABLET | ORAL | Status: DC

## 2015-07-13 NOTE — Telephone Encounter (Signed)
Refills sent to pharmacy. 

## 2015-07-26 ENCOUNTER — Telehealth: Payer: Self-pay | Admitting: Neurology

## 2015-07-26 NOTE — Telephone Encounter (Signed)
PT called and said she needed a refill on her medication/[name]  [phone]#[phone]

## 2015-07-26 NOTE — Telephone Encounter (Signed)
Returned call. Advised patient to call her pharmacy to get refill on Keppra. New Rx was sent to her pharmacy last month with refills.

## 2015-10-04 ENCOUNTER — Ambulatory Visit: Payer: Medicaid Other | Admitting: Neurology

## 2015-10-06 ENCOUNTER — Encounter: Payer: Self-pay | Admitting: Neurology

## 2015-10-06 ENCOUNTER — Ambulatory Visit (INDEPENDENT_AMBULATORY_CARE_PROVIDER_SITE_OTHER): Payer: Medicaid Other | Admitting: Neurology

## 2015-10-06 VITALS — BP 106/78 | HR 89 | Temp 98.5°F | Ht 66.0 in | Wt 186.0 lb

## 2015-10-06 DIAGNOSIS — G4700 Insomnia, unspecified: Secondary | ICD-10-CM

## 2015-10-06 DIAGNOSIS — G40009 Localization-related (focal) (partial) idiopathic epilepsy and epileptic syndromes with seizures of localized onset, not intractable, without status epilepticus: Secondary | ICD-10-CM

## 2015-10-06 MED ORDER — LEVETIRACETAM ER 500 MG PO TB24: ORAL_TABLET | ORAL | Status: DC

## 2015-10-06 MED ORDER — LEVETIRACETAM ER 500 MG PO TB24: ORAL_TABLET | ORAL | Status: AC

## 2015-10-06 MED ORDER — ZOLPIDEM TARTRATE 5 MG PO TABS: 5.0000 mg | ORAL_TABLET | Freq: Every evening | ORAL | Status: AC | PRN
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
## 2015-10-07 NOTE — Progress Notes (Signed)
Note routed

## 2016-10-13 ENCOUNTER — Ambulatory Visit: Payer: Medicaid Other | Admitting: Neurology

## 2018-08-10 ENCOUNTER — Emergency Department (HOSPITAL_COMMUNITY)
Admission: EM | Admit: 2018-08-10 | Discharge: 2018-08-10 | Disposition: A | Discharge status: Home / Self Care | Payer: Medicaid Other | Attending: Emergency Medicine | Admitting: Emergency Medicine

## 2018-08-10 ENCOUNTER — Other Ambulatory Visit: Payer: Self-pay

## 2018-08-10 ENCOUNTER — Encounter (HOSPITAL_COMMUNITY): Payer: Self-pay | Admitting: Emergency Medicine

## 2018-08-10 ENCOUNTER — Emergency Department (HOSPITAL_COMMUNITY): Payer: Medicaid Other

## 2018-08-10 DIAGNOSIS — Z79899 Other long term (current) drug therapy: Secondary | ICD-10-CM | POA: Insufficient documentation

## 2018-08-10 DIAGNOSIS — M25561 Pain in right knee: Secondary | ICD-10-CM | POA: Diagnosis present

## 2018-08-10 DIAGNOSIS — F17200 Nicotine dependence, unspecified, uncomplicated: Secondary | ICD-10-CM | POA: Diagnosis not present

## 2018-08-10 MED ORDER — IBUPROFEN 800 MG PO TABS
800.0000 mg | ORAL_TABLET | Freq: Once | ORAL | Status: AC
  Administered 2018-08-10: 800 mg via ORAL
  Filled 2018-08-10: qty 1

## 2018-08-10 MED ORDER — IBUPROFEN 800 MG PO TABS: 800.0000 mg | ORAL_TABLET | Freq: Three times a day (TID) | ORAL | 0 refills | Status: AC

## 2018-08-10 NOTE — ED Triage Notes (Signed)
Patient is complaining of right knee pain. Patient states that she bump her knee on the table on the15th. Patient states she had surgery in 2010 and now her knee hurts really bad. Patient brought in by [facility]. Patient is brought from [facility]. Patient is crying. Patient has a hx of opoid dependence.

## 2018-08-10 NOTE — ED Provider Notes (Signed)
[facility]-EMERGENCY DEPT Provider Note   CSN: [id number] Arrival date & time: 08/10/18  0056    History   Chief Complaint Chief Complaint  Patient presents with  . Knee Pain    HPI Laurie Grant is a 28 y.o. female.     Patient presents to the emergency department with a chief complaint of right knee pain.  She states that she has chronic right knee pain, but recently bumped her knee on a table.  She is afraid that she dislodged a screw in her knee.  She reports associated locking, clicking, and popping.  She no longer has follow-up with her orthopedic doctor.  She did not take anything for the symptoms.  Symptoms are worsened with movement and palpation.  The history is provided by the patient. No language interpreter was used.    Past Medical History:  Diagnosis Date  . Knee pain   . Seizures [facility])     Patient Active Problem List   Diagnosis Date Noted  . Localization-related idiopathic epilepsy and epileptic syndromes with seizures of localized onset, not intractable, without status epilepticus (HCC) 10/06/2015  . Seizures (HCC) 09/08/2013  . Insomnia 09/08/2013  . Knee pain 09/08/2013    Past Surgical History:  Procedure Laterality Date  . KNEE SURGERY    . KNEE SURGERY Bilateral    knees     OB History   No obstetric history on file.      Home Medications    Prior to Admission medications   Medication Sig Start Date End Date Taking? Authorizing Provider  acetaminophen (TYLENOL) 325 MG tablet Take 325 mg by mouth every 6 (six) hours as needed for mild pain or moderate pain.   Yes [provider]  albuterol (PROVENTIL HFA;VENTOLIN HFA) 108 (90 BASE) MCG/ACT inhaler Inhale 2 puffs into the lungs every 6 (six) hours as needed for wheezing or shortness of breath. 12/20/12  Yes Deatra Canter, FNP  Buprenorphine HCl-Naloxone HCl (ZUBSOLV) 5.7-1.4 MG SUBL Place 1 tablet under the tongue 3 (three) times daily.   Yes [provider]  cyclobenzaprine (FLEXERIL) 10 MG tablet Take 10 mg by mouth 3 (three) times daily as needed for muscle spasms.  08/07/18  Yes [provider]  gabapentin (NEURONTIN) 800 MG tablet Take 800 mg by mouth 2 (two) times daily.   Yes [provider]  levETIRAcetam (KEPPRA XR) 500 MG 24 hr tablet Take 2 tablets at bedtime Patient taking differently: Take 1,000 mg by mouth at bedtime. Take 2 tablets at bedtime 10/06/15  Yes Van Clines, MD  zolpidem (AMBIEN) 5 MG tablet Take 1 tablet (5 mg total) by mouth at bedtime as needed for sleep. 10/06/15  Yes Van Clines, MD  ibuprofen (ADVIL) 800 MG tablet Take 1 tablet (800 mg total) by mouth 3 (three) times daily. 08/10/18   Roxy Horseman, PA-C  medroxyPROGESTERone (DEPO-PROVERA) 150 MG/ML injection INJECT 1 ML (150 MG TOTAL) INTO THE MUSCLE EVERY 3 (THREE) MONTHS. DUE IN [place] FOR NEXT INJECTION Patient not taking: Reported on 08/10/2018 11/04/14   Mechele Claude, MD    Family History Family History  Problem Relation Age of Onset  . Arthritis Mother   . Asthma Mother   . Depression Mother   . Diabetes Mother   . Hyperlipidemia Mother   . Hypertension Mother   . Hypertension Father   . Healthy Brother   . Healthy Brother     Social History Social History  Tobacco Use  . Smoking status: Current Some Day Smoker  . Smokeless tobacco: Never Used  Substance Use Topics  . Alcohol use: Yes    Alcohol/week: 0.0 standard drinks    Comment: Occasional  . Drug use: No     Allergies   Penicillins and Amoxicillin   Review of Systems Review of Systems  All other systems reviewed and are negative.    Physical Exam Updated Vital Signs BP 129/76   Pulse 97   Temp 98.4 F (36.9 C) (Oral)   Resp 18   Ht 5\' 8"  (1.727 m)   Wt 77.1 kg   SpO2 97%   BMI 25.85 kg/m   Physical Exam  Nursing note and vitals reviewed.  Constitutional: Pt appears well-developed and well-nourished. No distress.  HENT:   Head: Normocephalic and atraumatic.  Eyes: Conjunctivae are normal.  Neck: Normal range of motion.  Cardiovascular: Normal rate, regular rhythm. Intact distal pulses.   Capillary refill < 3 sec.  Pulmonary/Chest: Effort normal and breath sounds normal.  Musculoskeletal:  RLE Pt exhibits TTP superomedial aspect with associated contusion.   ROM: limited by pain  Strength: limited by pain, joint stability testing not performed due to guarding and pain  Neurological: Pt  is alert. Coordination normal.  Sensation: 5/5 Skin: Skin is warm and dry. Pt is not diaphoretic.  No evidence of open wound or skin tenting Psychiatric: Pt has a normal mood and affect.    ED Treatments / Results  Labs (all labs ordered are listed, but only abnormal results are displayed) Labs Reviewed - No data to display  EKG None  Radiology Dg Knee Complete 4 Views Right  Result Date: 08/10/2018 CLINICAL DATA:  28 y/o  F; right knee pain. EXAM: RIGHT KNEE - COMPLETE 4+ VIEW COMPARISON:  08/19/2012 right knee MRI. 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 Postsurgical changes with tibial tuberosity screw without apparent hardware related complication. No acute fracture, dislocation, or joint effusion. Soft tissues are unremarkable. IMPRESSION: 1. No acute fracture or dislocation. 2. 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 Electronically Signed   By: Mitzi Hansen M.D.   On: 08/10/2018 01:25    Procedures Procedures (including critical care time)  Medications Ordered in ED Medications  ibuprofen (ADVIL) tablet 800 mg (has no administration in time range)     Initial Impression / Assessment and Plan / ED Course  I have reviewed the triage vital signs and the nursing notes.  Pertinent labs & imaging results that were available during my care of the patient were reviewed by me and considered in my medical decision making (see chart for details).         Patient presents with injury to right knee.  DDx includes, fracture, strain, or sprain.  Consultants: none  Plain films reveal no acute fracture or dislocation.  Pt advised to follow up with PCP and/or orthopedics. Patient given knee immobilizer and crutches while in ED, conservative therapy such as RICE recommended and discussed.   Patient will be discharged home & is agreeable with above plan. Returns precautions discussed. Pt appears safe for discharge.   Final Clinical Impressions(s) / ED Diagnoses   Final diagnoses:  Acute pain of right knee    ED Discharge Orders         Ordered    ibuprofen (ADVIL) 800 MG tablet  3 times daily     08/10/18 0216           Dahlia Client,  Molly MaduroRobert, PA-C 08/10/18 0220    Dione BoozeGlick, David, MD 08/10/18 [phone]

## 2018-08-10 NOTE — ED Notes (Signed)
Bed: WA07 Expected date:  Expected time:  Means of arrival:  Comments: 

## 2020-01-11 IMAGING — CR RIGHT KNEE - COMPLETE 4+ VIEW
4 series · 4 of 4 positions shown · non-contrast
Comparison: 08/19/2012 right knee MRI.

CLINICAL DATA: 28 y/o  F; right knee pain.

EXAM:
RIGHT KNEE - COMPLETE 4+ VIEW

[x knee ap right (1 of 3)]
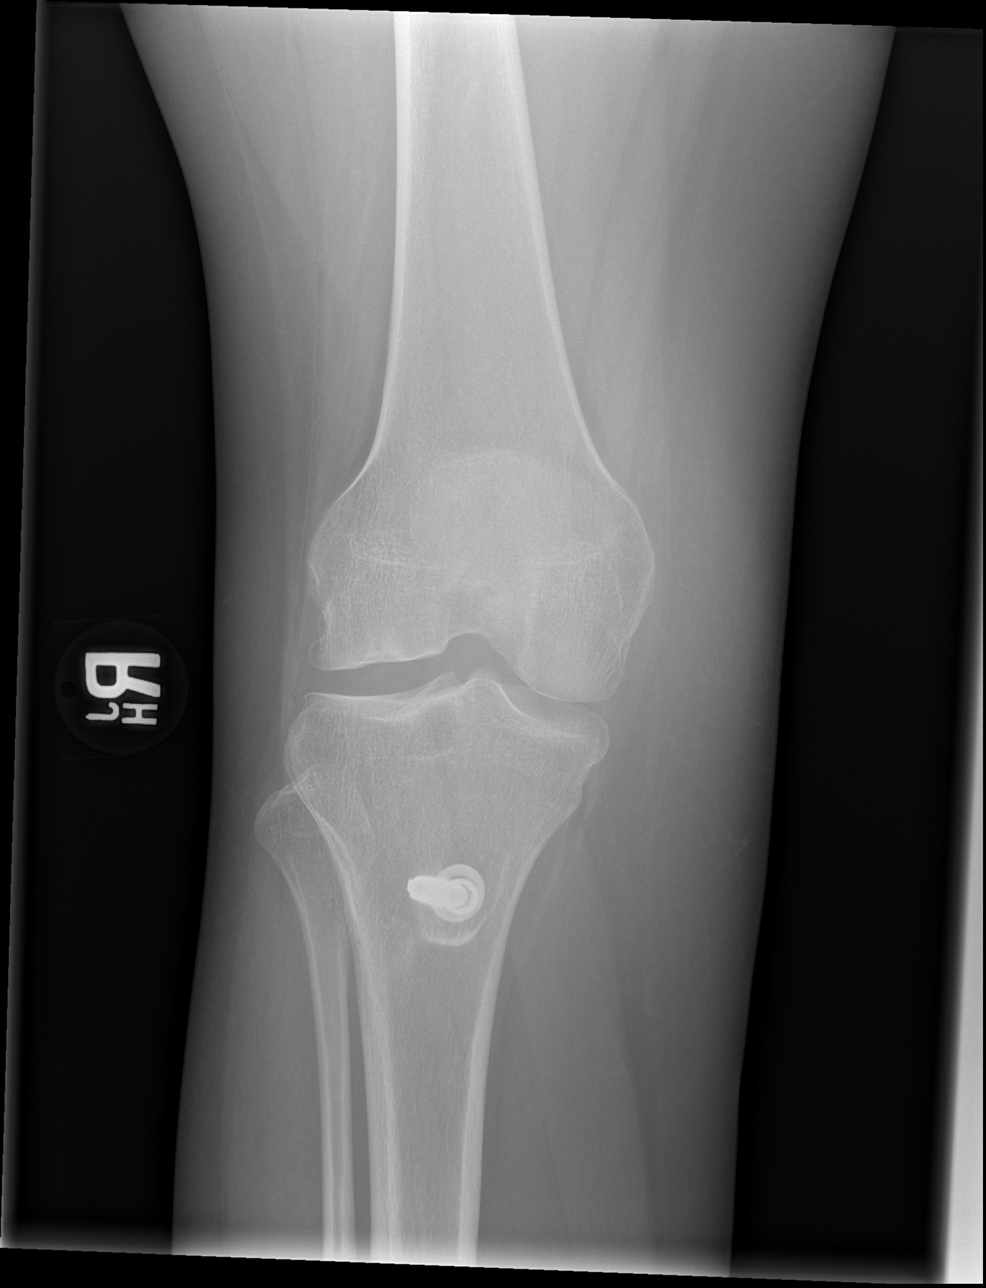

[x knee ap right (2 of 3)]
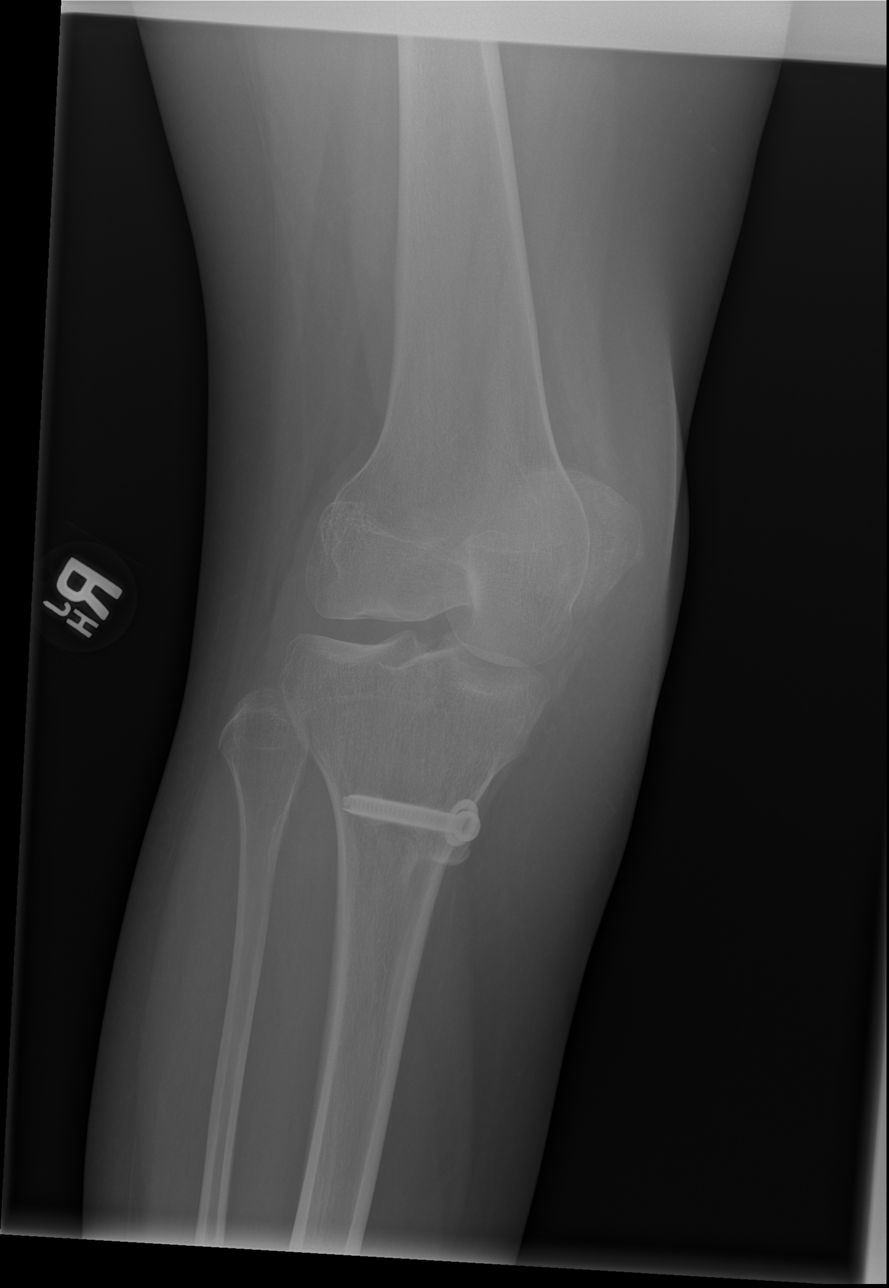

[x knee ap right (3 of 3)]
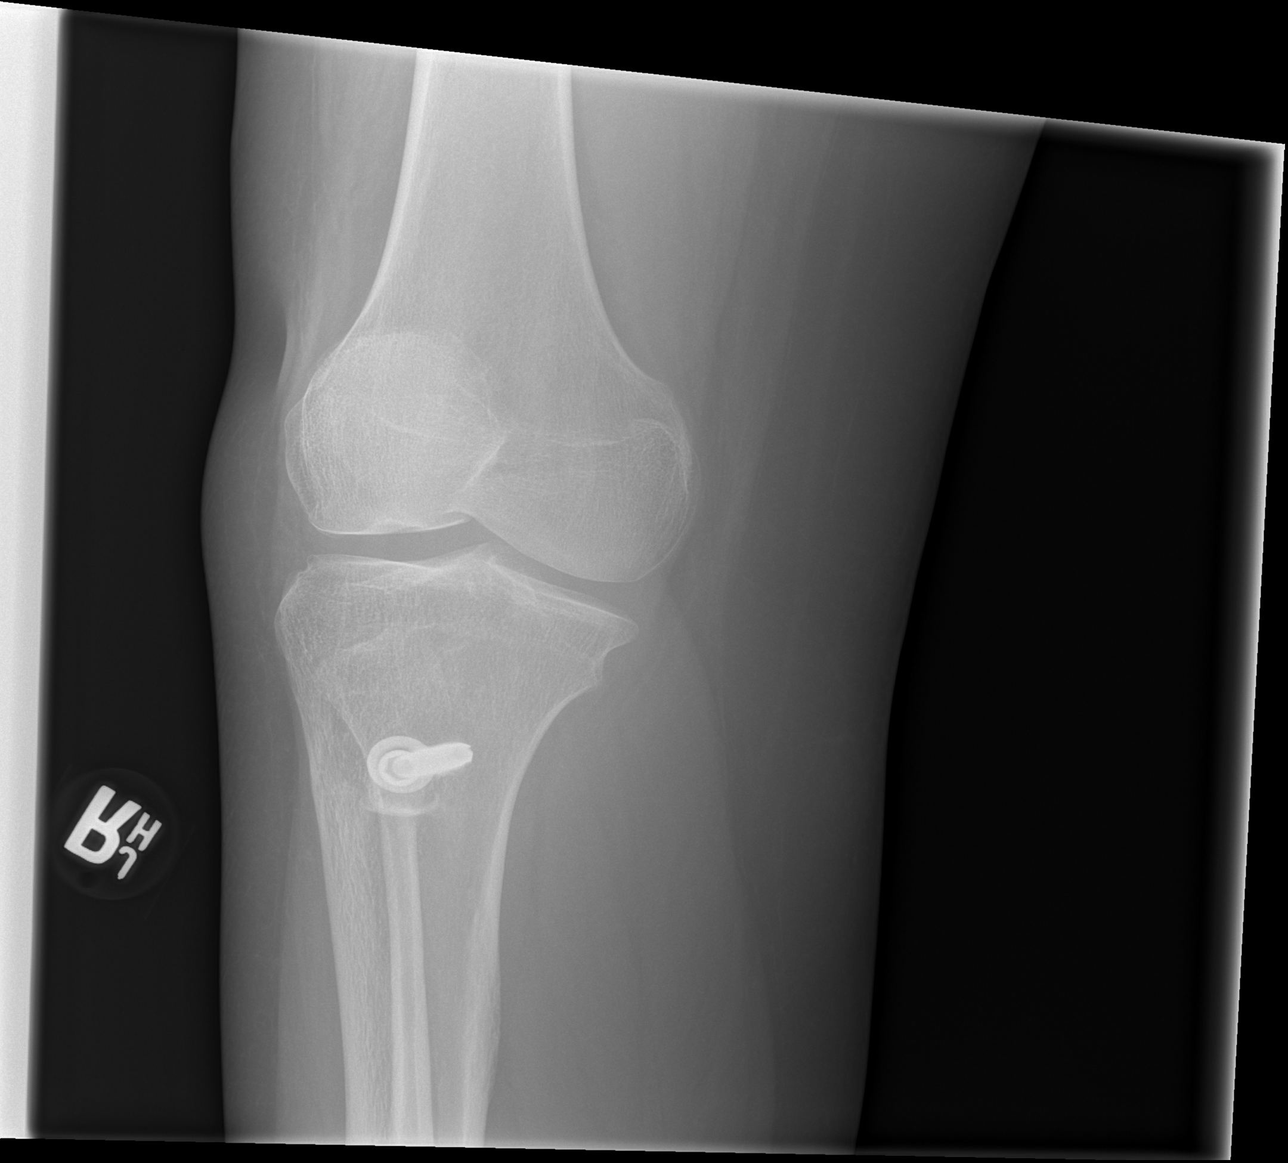

[x knee lat right]
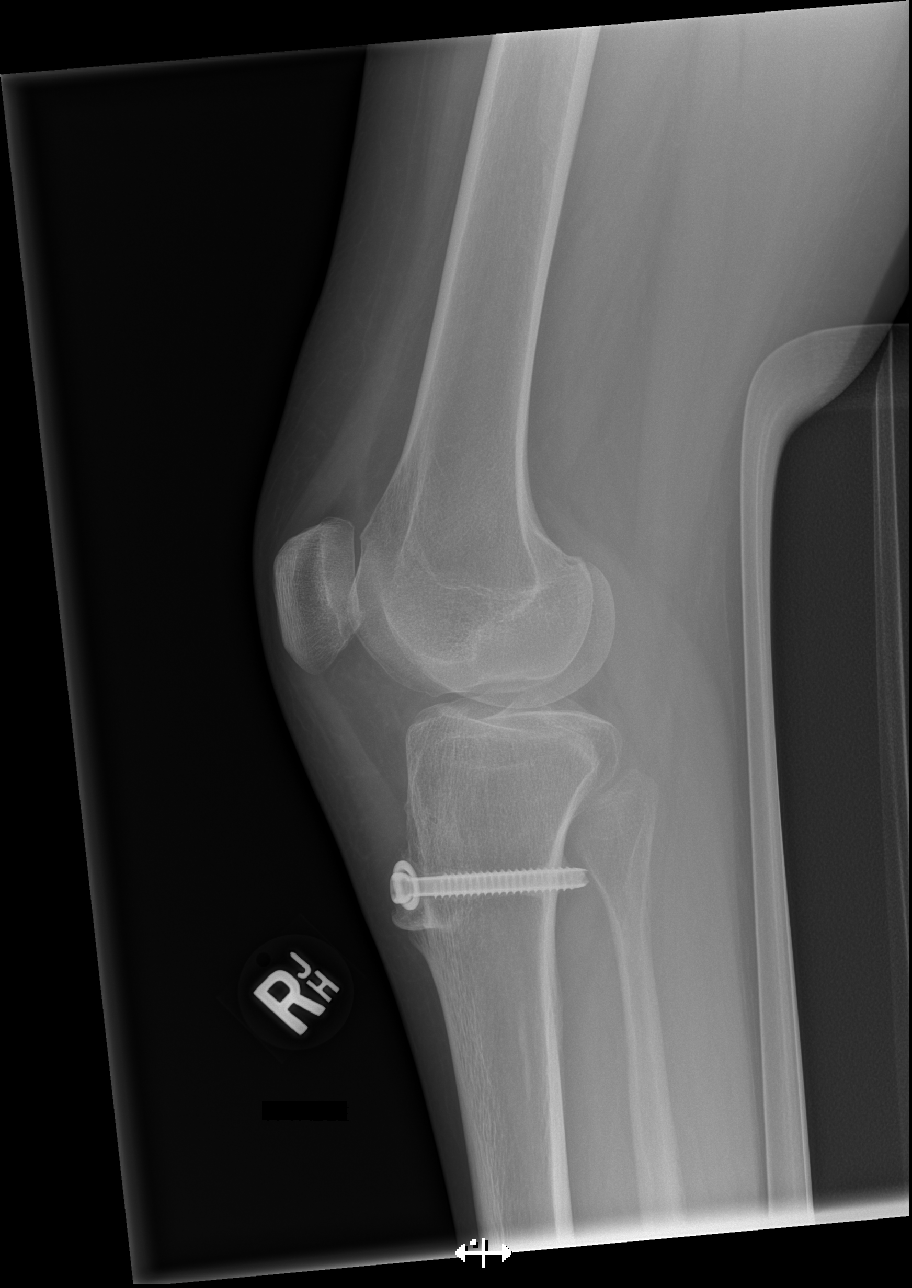

[4 of 4 positions shown; findings below may reference images not displayed]

FINDINGS: Stable osteochondral lesion of the lateral femoral condyle.
Postsurgical changes with tibial tuberosity screw without apparent
hardware related complication. No acute fracture, dislocation, or
joint effusion. Soft tissues are unremarkable.
IMPRESSION: 1. No acute fracture or dislocation.
2. Stable osteochondral lesion of the lateral femoral condyle.
